# Patient Record
Sex: Female | Born: 1960 | Race: Black or African American | Hispanic: No | Marital: Married | Smoking: Never smoker
Health system: Southern US, Community
[De-identification: ages and names within clinical notes are randomized; demographics above are authoritative.]

## PROBLEM LIST (undated history)

## (undated) DIAGNOSIS — E7800 Pure hypercholesterolemia, unspecified: Secondary | ICD-10-CM

## (undated) HISTORY — DX: Pure hypercholesterolemia, unspecified: E78.00

## (undated) HISTORY — PX: MASTECTOMY: SHX3

---

## 1999-10-08 DIAGNOSIS — C50919 Malignant neoplasm of unspecified site of unspecified female breast: Secondary | ICD-10-CM

## 1999-10-08 HISTORY — PX: MASTECTOMY: SHX3

## 1999-10-08 HISTORY — DX: Malignant neoplasm of unspecified site of unspecified female breast: C50.919

## 2007-11-13 ENCOUNTER — Encounter: Admission: RE | Admit: 2007-11-13 | Discharge: 2007-11-13 | Payer: Self-pay | Admitting: *Deleted

## 2011-07-29 DIAGNOSIS — C50919 Malignant neoplasm of unspecified site of unspecified female breast: Secondary | ICD-10-CM | POA: Insufficient documentation

## 2011-12-19 ENCOUNTER — Encounter: Payer: Self-pay | Admitting: *Deleted

## 2011-12-19 ENCOUNTER — Emergency Department
Admission: EM | Admit: 2011-12-19 | Discharge: 2011-12-19 | Disposition: A | Discharge status: Home Health | Payer: BC Managed Care – PPO | Source: Home / Self Care

## 2011-12-19 DIAGNOSIS — K219 Gastro-esophageal reflux disease without esophagitis: Secondary | ICD-10-CM

## 2011-12-19 MED ORDER — PROMETHAZINE HCL 12.5 MG PO TABS: 12.5000 mg | ORAL_TABLET | Freq: Four times a day (QID) | ORAL | Status: AC | PRN

## 2011-12-19 MED ORDER — GI COCKTAIL ~~LOC~~
30.0000 mL | Freq: Once | ORAL | Status: AC
  Administered 2011-12-19: 30 mL via ORAL

## 2011-12-19 MED ORDER — RANITIDINE HCL 150 MG PO TABS: 150.0000 mg | ORAL_TABLET | Freq: Two times a day (BID) | ORAL | Status: AC

## 2011-12-19 NOTE — ED Notes (Signed)
Patient c/o persistent heart burn x 4 days that is progressively worsening. Yesterday she developed vomiting with diarrhea. The vomitng has stopped but diarrhea continues today. She has used Mylanta.

## 2011-12-19 NOTE — ED Provider Notes (Signed)
History     CSN: [id number]  Arrival date & time 12/19/11  0943   None     Chief Complaint  Patient presents with  . Heartburn  . Diarrhea    (Consider location/radiation/quality/duration/timing/severity/associated sxs/prior treatment) HPI Patient complains of persistent heartburn for the last 4 days that is progressively getting worse. She has had some nausea, vomiting and diarrhea recently and has had some sick contacts. She has used Mylanta which has helped a little bit. She states that she had a [employer] hamburger yesterday which made the symptoms worse. No chest pain, shortness of breath, diaphoresis. She states the discomfort is mostly in the upper middle chest area and into her throat and gets worse when she lays down at night. No fever or chills.  History reviewed. No pertinent past medical history.  Past Surgical History  Procedure Date  . Mastectomy     right    Family History  Problem Relation Age of Onset  . Heart failure Father   . Cancer Sister     uterine    History  Substance Use Topics  . Smoking status: Never Smoker   . Smokeless tobacco: Not on file  . Alcohol Use: No    OB History    Grav Para Term Preterm Abortions TAB SAB Ect Mult Living                  Review of Systems  All other systems reviewed and are negative.    Allergies  Review of patient's allergies indicates no known allergies.  Home Medications   Current Outpatient Rx  Name Route Sig Dispense Refill  . PROMETHAZINE HCL 12.5 MG PO TABS Oral Take 1 tablet (12.5 mg total) by mouth every 6 (six) hours as needed for nausea. 24 tablet 0  . RANITIDINE HCL 150 MG PO TABS Oral Take 1 tablet (150 mg total) by mouth 2 (two) times daily. 30 tablet 0    BP 99/68  Pulse 86  Temp(Src) 98.6 F (37 C) (Oral)  Resp 12  Ht 5\' 8"  (1.727 m)  Wt 176 lb (79.833 kg)  BMI 26.76 kg/m2  SpO2 99%  Physical Exam  Nursing note and vitals reviewed. Constitutional: She is oriented to  person, place, and time. She appears well-developed and well-nourished.  HENT:  Head: Normocephalic and atraumatic.  Eyes: No scleral icterus.  Neck: Neck supple.  Cardiovascular: Normal rate, regular rhythm and normal heart sounds.   Pulmonary/Chest: Effort normal and breath sounds normal. No respiratory distress. She has no decreased breath sounds. She has no wheezes. She has no rhonchi.  Abdominal: Soft. Normal appearance. There is no tenderness. There is no rigidity, no rebound, no guarding, no tenderness at McBurney's point and negative Murphy's sign.  Neurological: She is alert and oriented to person, place, and time.  Skin: Skin is warm and dry.  Psychiatric: She has a normal mood and affect. Her speech is normal.    ED Course  Procedures (including critical care time)  Labs Reviewed - No data to display No results found.   1. GERD (gastroesophageal reflux disease)       MDM   In clinic we gave her a GI cocktail which improved her symptoms by about 50%. Because of this we're going to treat her with Phenergan and Zantac for the next week or 2 to control stomach acid. If her symptoms are continuing she will need to followup with her PCP or a gastroenterologist. I did not see any  evidence of a cardiac etiology. If nausea vomiting or she develops any hematemesis or dark stools, ER precautions are given to her.  Encourage bland diet (nothing spicy, greasy, fried, or acidic), hydration, and rest.  If worsening of pain, nausea, or other symptoms, advised to contact your PCP.        Marlaine Hind, MD 12/19/11 1040

## 2012-09-23 DIAGNOSIS — M25519 Pain in unspecified shoulder: Secondary | ICD-10-CM | POA: Insufficient documentation

## 2013-08-31 DIAGNOSIS — E559 Vitamin D deficiency, unspecified: Secondary | ICD-10-CM | POA: Insufficient documentation

## 2014-04-15 ENCOUNTER — Other Ambulatory Visit (HOSPITAL_COMMUNITY): Payer: Self-pay | Admitting: Respiratory Therapy

## 2014-04-15 DIAGNOSIS — R0602 Shortness of breath: Secondary | ICD-10-CM

## 2014-04-25 ENCOUNTER — Encounter (HOSPITAL_COMMUNITY): Payer: BC Managed Care – PPO

## 2014-05-10 ENCOUNTER — Encounter (HOSPITAL_COMMUNITY): Payer: BC Managed Care – PPO

## 2014-05-11 ENCOUNTER — Ambulatory Visit
Admission: RE | Admit: 2014-05-11 | Discharge: 2014-05-11 | Disposition: A | Discharge status: Home / Self Care | Payer: BC Managed Care – PPO | Source: Ambulatory Visit | Attending: Emergency Medicine | Admitting: Emergency Medicine

## 2014-05-11 ENCOUNTER — Other Ambulatory Visit: Payer: Self-pay | Admitting: Emergency Medicine

## 2014-05-11 DIAGNOSIS — R0602 Shortness of breath: Secondary | ICD-10-CM

## 2014-05-11 MED ORDER — IOHEXOL 350 MG/ML SOLN
125.0000 mL | Freq: Once | INTRAVENOUS | Status: AC | PRN
  Administered 2014-05-11: 125 mL via INTRAVENOUS

## 2014-05-19 ENCOUNTER — Ambulatory Visit (HOSPITAL_COMMUNITY)
Admission: RE | Admit: 2014-05-19 | Discharge: 2014-05-19 | Disposition: A | Discharge status: Home / Self Care | Payer: BC Managed Care – PPO | Source: Ambulatory Visit | Attending: Emergency Medicine | Admitting: Emergency Medicine

## 2014-05-19 DIAGNOSIS — R0602 Shortness of breath: Secondary | ICD-10-CM | POA: Insufficient documentation

## 2014-05-19 MED ORDER — ALBUTEROL SULFATE (2.5 MG/3ML) 0.083% IN NEBU
2.5000 mg | INHALATION_SOLUTION | Freq: Once | RESPIRATORY_TRACT | Status: AC
  Administered 2014-05-19: 2.5 mg via RESPIRATORY_TRACT

## 2014-05-20 LAB — PULMONARY FUNCTION TEST
DL/VA % pred: 131 %
DL/VA: 6.9 ml/min/mmHg/L
DLCO UNC % PRED: 100 %
DLCO UNC: 29.83 ml/min/mmHg
FEF 25-75 PRE: 2.52 L/s
FEF 25-75 Post: 3.25 L/sec
FEF2575-%Change-Post: 29 %
FEF2575-%PRED-POST: 121 %
FEF2575-%Pred-Pre: 94 %
FEV1-%Change-Post: 7 %
FEV1-%Pred-Post: 89 %
FEV1-%Pred-Pre: 83 %
FEV1-PRE: 2.22 L
FEV1-Post: 2.38 L
FEV1FVC-%Change-Post: 4 %
FEV1FVC-%PRED-PRE: 101 %
FEV6-%Change-Post: 3 %
FEV6-%PRED-POST: 85 %
FEV6-%Pred-Pre: 83 %
FEV6-POST: 2.78 L
FEV6-Pre: 2.69 L
FEV6FVC-%CHANGE-POST: 0 %
FEV6FVC-%PRED-POST: 103 %
FEV6FVC-%PRED-PRE: 102 %
FVC-%CHANGE-POST: 2 %
FVC-%PRED-PRE: 81 %
FVC-%Pred-Post: 83 %
FVC-POST: 2.78 L
FVC-Pre: 2.7 L
PRE FEV1/FVC RATIO: 82 %
Post FEV1/FVC ratio: 86 %
Post FEV6/FVC ratio: 100 %
Pre FEV6/FVC Ratio: 100 %
RV % pred: 82 %
RV: 1.67 L
TLC % pred: 80 %
TLC: 4.53 L

## 2015-05-30 IMAGING — CT CT ANGIO CHEST
2 of 6 series · 19 of 36 positions shown · IV contrast ([ID] [id number])
Comparison: Chest radiograph November 13, 2007

CLINICAL DATA: Difficulty breathing; history of breast carcinoma

EXAM:
CT ANGIOGRAPHY CHEST WITH CONTRAST
TECHNIQUE: Multidetector CT imaging of the chest was performed using the
standard protocol during bolus administration of intravenous
contrast. Multiplanar CT image reconstructions and MIPs were
obtained to evaluate the vascular anatomy.
CONTRAST:  125mL OMNIPAQUE IOHEXOL 350 MG/ML SOLN

[Series 5: pe thin 1.25 · axial · 0.70mm/px · z∈[-292,-3]mm · 18 of 323 slices shown]
[im 17/323  lung]
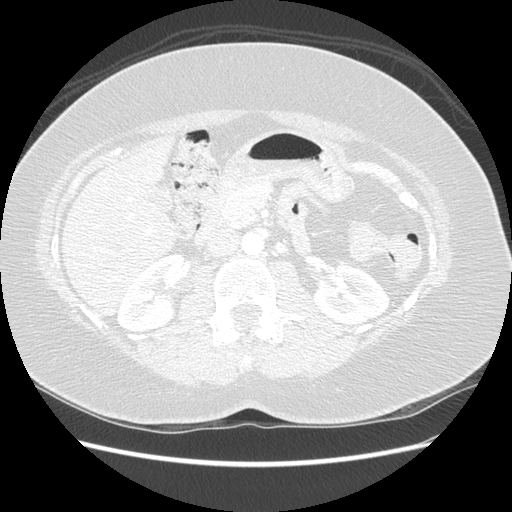
[im 34/323  mediastinal]
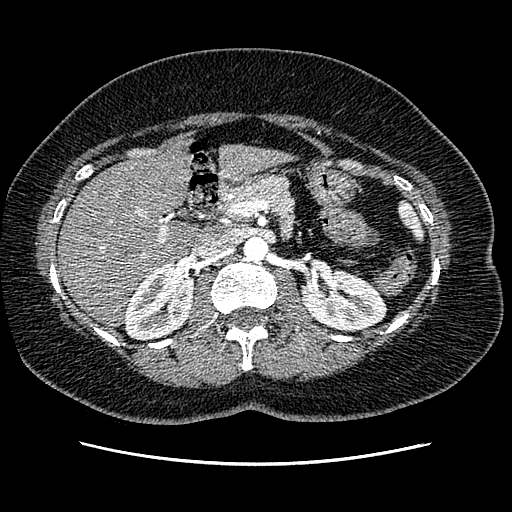
[im 51/323  lung]
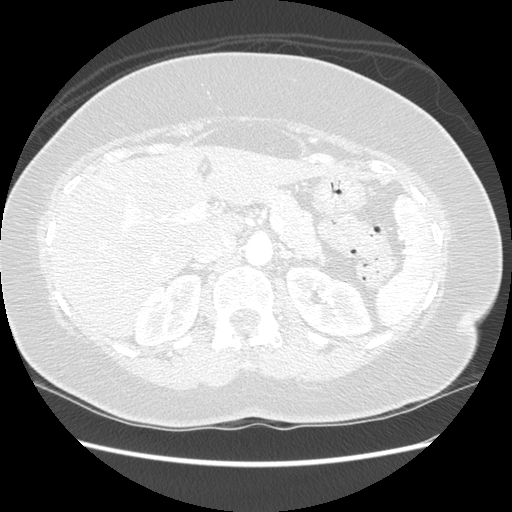
[im 68/323  mediastinal]
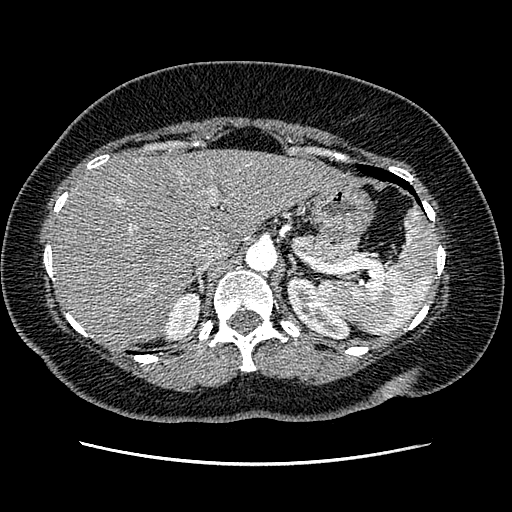
[im 85/323  lung]
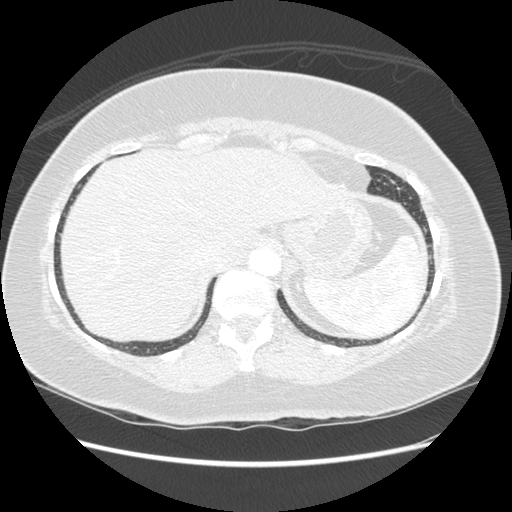
[im 102/323  mediastinal]
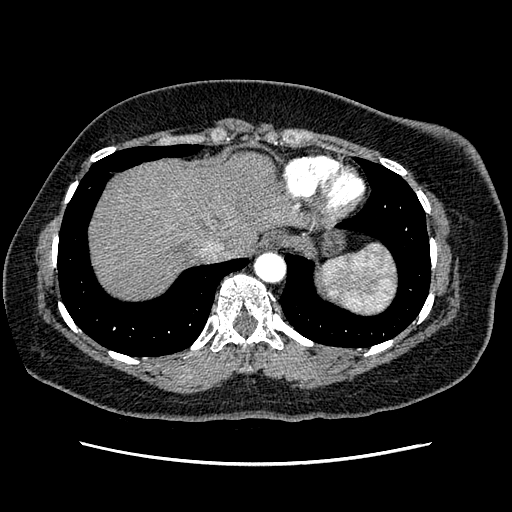
[im 119/323  lung]
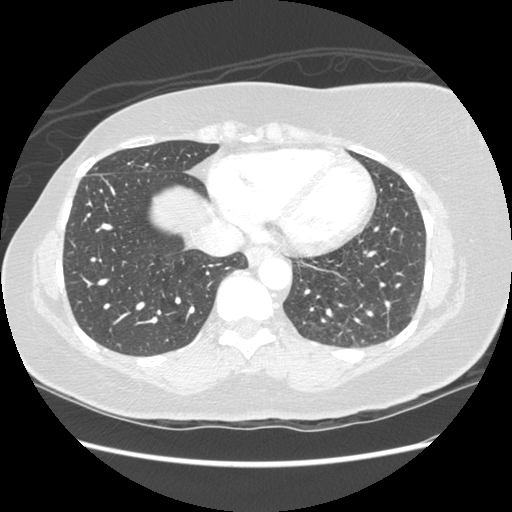
[im 136/323  mediastinal]
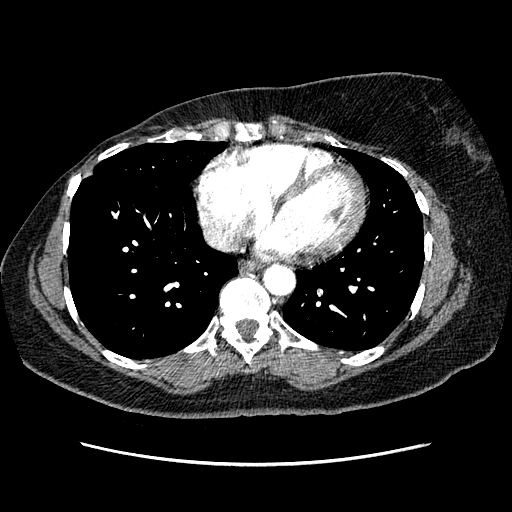
[im 153/323  lung]
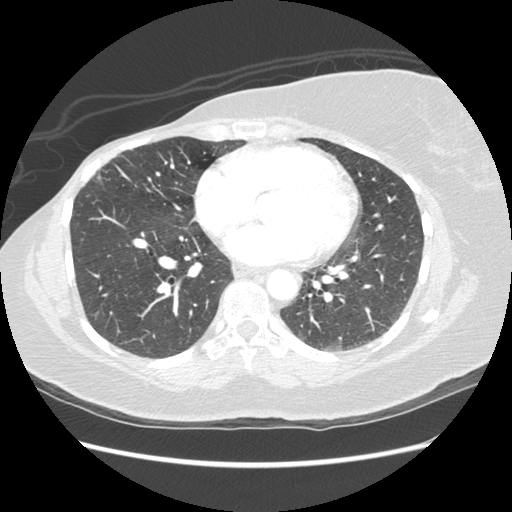
[im 170/323  mediastinal]
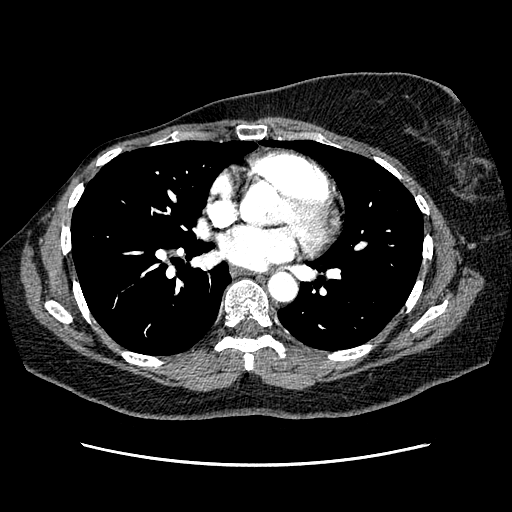
[im 187/323  lung]
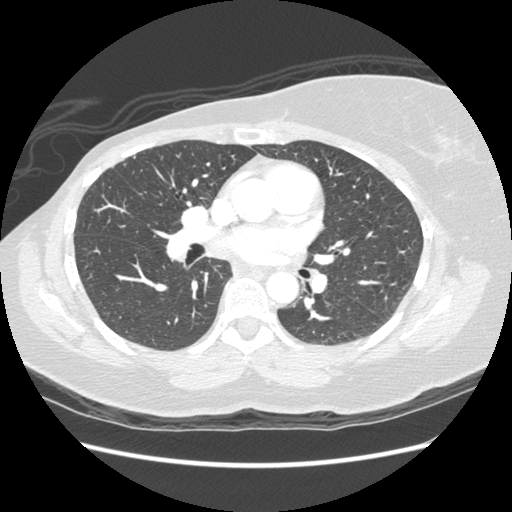
[im 204/323  mediastinal]
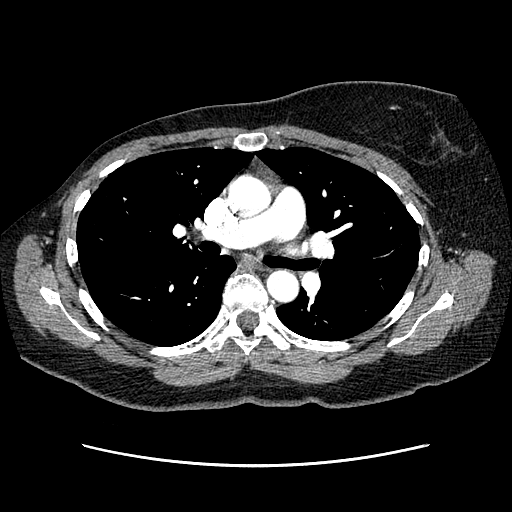
[im 221/323  lung]
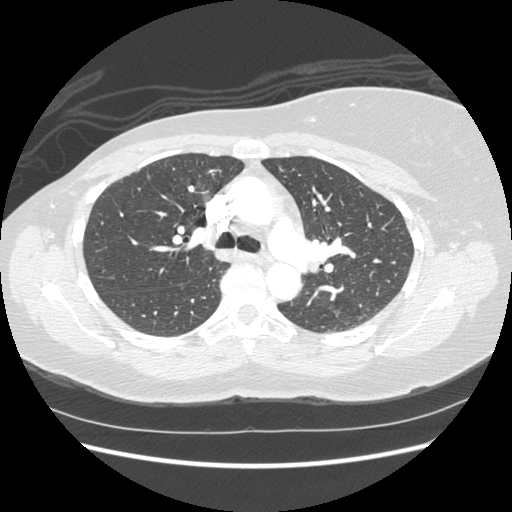
[im 238/323  mediastinal]
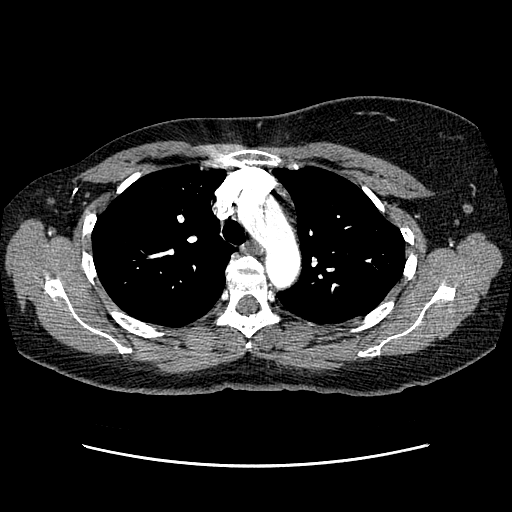
[im 255/323  lung]
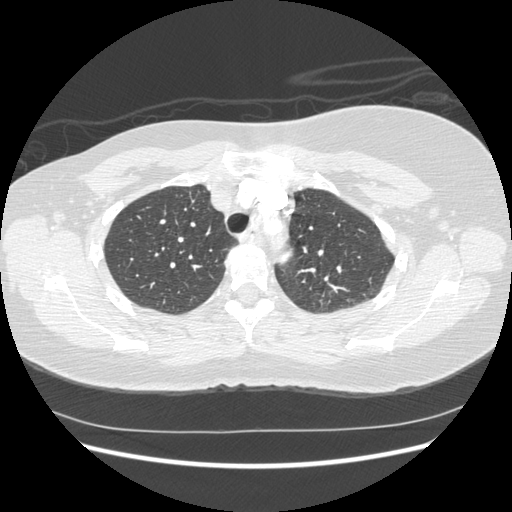
[im 272/323  mediastinal]
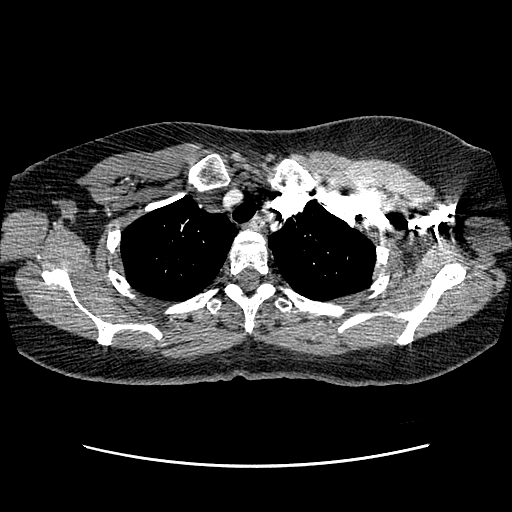
[im 289/323  lung]
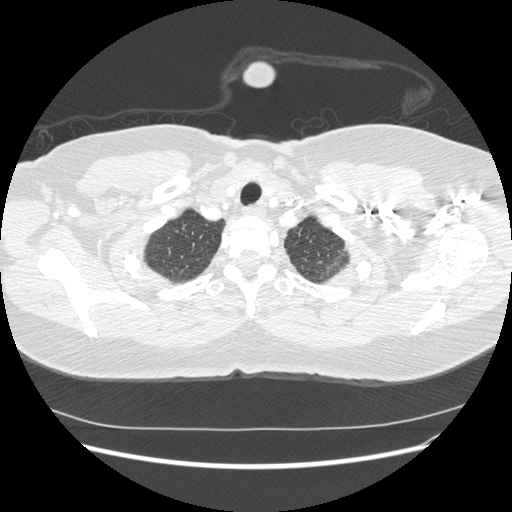
[im 306/323  mediastinal]
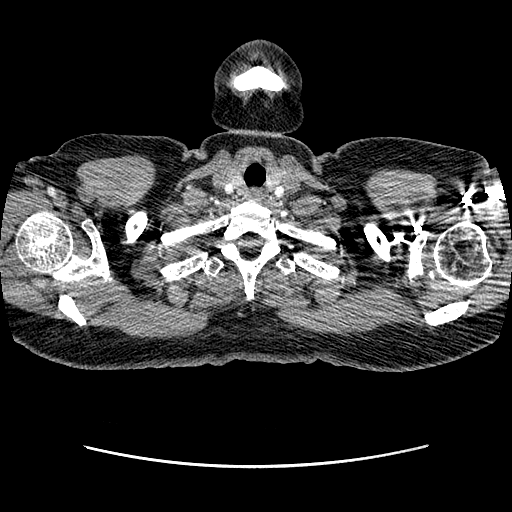

[Series 104: cor · coronal · 0.70mm/px · 1 of 134 slices shown]
[im 67/134  mediastinal]
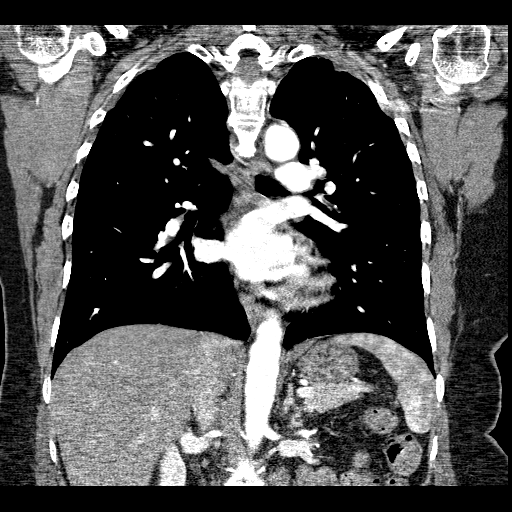

[19 of 36 positions shown; findings below may reference images not displayed]

FINDINGS: There is no demonstrable pulmonary embolus. There is no thoracic
aortic aneurysm or dissection. The patient is status post right
mastectomy with surgical clips in the right axilla.

There is mild underlying centrilobular emphysematous change. There
is patchy atelectatic change in the left lower lobe and in the
posterior segment of the left upper lobe. There is no frank edema or
consolidation.

There is no appreciable thoracic adenopathy. The pericardium is not
thickened. There is a rather minimal hiatal hernia. In the
visualized upper abdomen, there is fatty liver. Visualized upper
abdominal structures otherwise appear normal. There is degenerative
change in the thoracic spine. There are no blastic or lytic bone
lesions. Thyroid is unremarkable.

Review of the MIP images confirms the above findings.
IMPRESSION: No demonstrable pulmonary embolus. Status post right mastectomy.
Patchy atelectasis in the left lung. No edema or consolidation. No
mass or adenopathy appreciable. Fatty liver is noted.

## 2016-09-28 ENCOUNTER — Encounter: Payer: Self-pay | Admitting: Emergency Medicine

## 2016-09-28 ENCOUNTER — Emergency Department (INDEPENDENT_AMBULATORY_CARE_PROVIDER_SITE_OTHER)
Admission: EM | Admit: 2016-09-28 | Discharge: 2016-09-28 | Disposition: A | Discharge status: Home / Self Care | Payer: BLUE CROSS/BLUE SHIELD | Source: Home / Self Care | Attending: Family Medicine | Admitting: Family Medicine

## 2016-09-28 DIAGNOSIS — Z4889 Encounter for other specified surgical aftercare: Secondary | ICD-10-CM

## 2016-09-28 MED ORDER — MUPIROCIN 2 % EX OINT: 1.0000 "application " | TOPICAL_OINTMENT | Freq: Three times a day (TID) | CUTANEOUS | 0 refills | Status: DC

## 2016-09-28 NOTE — ED Triage Notes (Signed)
Pt had 2 cysts removed from vagina about 10 days ago by her GYN.  Pt feels that the area is opening back up and would like to be checked.  Currently on antbs.

## 2016-09-28 NOTE — ED Provider Notes (Signed)
[facility]    CSN: [id number] Arrival date & time: 09/28/16  1439     History   Chief Complaint Chief Complaint  Patient presents with  . Wound Check    HPI Anne Watkins is a 55 y.o. female.   Patient reports that she had two cysts removed from her left labia by her GYN ten days ago.  There has been no increase in pain or drainage, but she is concerned that the wound may be opening.  She is presently taking doxycycline.  No fevers, chills, and sweats.   The history is provided by the patient.    History reviewed. No pertinent past medical history.  There are no active problems to display for this patient.   Past Surgical History:  Procedure Laterality Date  . MASTECTOMY     right    OB History    No data available       Home Medications    Prior to Admission medications   Medication Sig Start Date End Date Taking? Authorizing Provider  doxycycline (VIBRAMYCIN) 100 MG capsule Take 100 mg by mouth 2 (two) times daily.   Yes Historical Provider, MD  mupirocin ointment (BACTROBAN) 2 % Apply 1 application topically 3 (three) times daily. 09/28/16   Kandra Nicolas, MD    Family History Family History  Problem Relation Age of Onset  . Heart failure Father   . Cancer Sister     uterine    Social History Social History  Substance Use Topics  . Smoking status: Never Smoker  . Smokeless tobacco: Never Used  . Alcohol use No     Allergies   Patient has no known allergies.   Review of Systems Review of Systems  Constitutional: Negative for activity change, chills, diaphoresis, fatigue and fever.  HENT: Negative.   Eyes: Negative.   Respiratory: Negative.   Cardiovascular: Negative.   Gastrointestinal: Negative.   Genitourinary: Positive for genital sores. Negative for difficulty urinating, dysuria, hematuria, pelvic pain, urgency, vaginal bleeding, vaginal discharge and vaginal pain.  Musculoskeletal: Negative.   Skin:  Negative.   Neurological: Negative for headaches.     Physical Exam Triage Vital Signs ED Triage Vitals  Enc Vitals Group     BP 09/28/16 1643 115/81     Pulse Rate 09/28/16 1643 73     Resp --      Temp 09/28/16 1643 97.6 F (36.4 C)     Temp Source 09/28/16 1643 Oral     SpO2 09/28/16 1643 100 %     Weight 09/28/16 1643 199 lb 8 oz (90.5 kg)     Height 09/28/16 1643 5\' 7"  (1.702 m)     Head Circumference --      Peak Flow --      Pain Score 09/28/16 1645 0     Pain Loc --      Pain Edu? --      Excl. in [place]? --    No data found.   Updated Vital Signs BP 115/81 (BP Location: Left Arm)   Pulse 73   Temp 97.6 F (36.4 C) (Oral)   Ht 5\' 7"  (1.702 m)   Wt 199 lb 8 oz (90.5 kg)   SpO2 100%   BMI 31.25 kg/m   Visual Acuity Right Eye Distance:   Left Eye Distance:   Bilateral Distance:    Right Eye Near:   Left Eye Near:    Bilateral Near:     Physical Exam  Constitutional: She appears well-developed and well-nourished. No distress.  HENT:  Head: Normocephalic.  Eyes: Pupils are equal, round, and reactive to light.  Neck: Neck supple.  Cardiovascular: Normal rate.   Pulmonary/Chest: Effort normal.  Abdominal: There is no tenderness.  Genitourinary:     Genitourinary Comments: Healing I & D wound left labia as noted on diagram. ; no evidence dehiscence.  Inferior aspect has an area of superficial excoriation without tenderness to palpation.  Lymphadenopathy:    She has no cervical adenopathy.  Neurological: She is alert.  Skin: Skin is warm and dry.  Nursing note and vitals reviewed.    UC Treatments / Results  Labs (all labs ordered are listed, but only abnormal results are displayed) Labs Reviewed - No data to display  EKG  EKG Interpretation None       Radiology No results found.  Procedures Procedures (including critical care time)  Medications Ordered in UC Medications - No data to display   Initial Impression / Assessment and  Plan / UC Course  I have reviewed the triage vital signs and the nursing notes.  Pertinent labs & imaging results that were available during my care of the patient were reviewed by me and considered in my medical decision making (see chart for details).  Clinical Course   Reassurance; surgical wound appears to be healing well Begin Bactrban ointment TID. Continue doxycycline as prescribed. Followup with gyn if not improved one week.     Final Clinical Impressions(s) / UC Diagnoses   Final diagnoses:  Encounter for post surgical wound check    New Prescriptions New Prescriptions   MUPIROCIN OINTMENT (BACTROBAN) 2 %    Apply 1 application topically 3 (three) times daily.     Kandra Nicolas, MD 10/14/16 1435

## 2016-09-28 NOTE — Discharge Instructions (Signed)
Continue doxycycline as prescribed

## 2020-05-19 LAB — HM MAMMOGRAPHY

## 2021-06-01 ENCOUNTER — Other Ambulatory Visit: Payer: Self-pay | Admitting: Family Medicine

## 2021-06-01 DIAGNOSIS — N644 Mastodynia: Secondary | ICD-10-CM

## 2021-06-06 ENCOUNTER — Other Ambulatory Visit: Payer: Self-pay | Admitting: Family Medicine

## 2021-06-06 DIAGNOSIS — N644 Mastodynia: Secondary | ICD-10-CM

## 2021-06-15 LAB — HM MAMMOGRAPHY

## 2022-05-21 ENCOUNTER — Other Ambulatory Visit (HOSPITAL_COMMUNITY)
Admission: RE | Admit: 2022-05-21 | Discharge: 2022-05-21 | Disposition: A | Discharge status: Home / Self Care | Payer: 59 | Source: Ambulatory Visit | Attending: Nurse Practitioner | Admitting: Nurse Practitioner

## 2022-05-21 ENCOUNTER — Other Ambulatory Visit: Payer: Self-pay | Admitting: Nurse Practitioner

## 2022-05-21 DIAGNOSIS — Z124 Encounter for screening for malignant neoplasm of cervix: Secondary | ICD-10-CM | POA: Insufficient documentation

## 2022-05-22 ENCOUNTER — Other Ambulatory Visit: Payer: Self-pay | Admitting: Nurse Practitioner

## 2022-05-22 DIAGNOSIS — Z780 Asymptomatic menopausal state: Secondary | ICD-10-CM

## 2022-05-22 LAB — CYTOLOGY - PAP
Comment: NEGATIVE
Diagnosis: NEGATIVE
High risk HPV: NEGATIVE

## 2022-05-29 ENCOUNTER — Other Ambulatory Visit: Payer: BLUE CROSS/BLUE SHIELD

## 2022-08-05 LAB — HM MAMMOGRAPHY

## 2022-10-05 ENCOUNTER — Ambulatory Visit
Admission: EM | Admit: 2022-10-05 | Discharge: 2022-10-05 | Disposition: A | Discharge status: Home / Self Care | Payer: 59 | Attending: Family Medicine | Admitting: Family Medicine

## 2022-10-05 ENCOUNTER — Other Ambulatory Visit: Payer: Self-pay

## 2022-10-05 DIAGNOSIS — J0100 Acute maxillary sinusitis, unspecified: Secondary | ICD-10-CM | POA: Diagnosis not present

## 2022-10-05 DIAGNOSIS — R059 Cough, unspecified: Secondary | ICD-10-CM | POA: Diagnosis not present

## 2022-10-05 DIAGNOSIS — J309 Allergic rhinitis, unspecified: Secondary | ICD-10-CM | POA: Diagnosis not present

## 2022-10-05 LAB — POC INFLUENZA A AND B ANTIGEN (URGENT CARE ONLY)
Influenza A Ag: NEGATIVE
Influenza B Ag: NEGATIVE

## 2022-10-05 LAB — POC SARS CORONAVIRUS 2 AG -  ED: SARS Coronavirus 2 Ag: NEGATIVE

## 2022-10-05 MED ORDER — PREDNISONE 20 MG PO TABS: ORAL_TABLET | ORAL | 0 refills | Status: DC

## 2022-10-05 MED ORDER — DOXYCYCLINE HYCLATE 100 MG PO CAPS: 100.0000 mg | ORAL_CAPSULE | Freq: Two times a day (BID) | ORAL | 0 refills | Status: AC

## 2022-10-05 MED ORDER — BENZONATATE 200 MG PO CAPS: 200.0000 mg | ORAL_CAPSULE | Freq: Three times a day (TID) | ORAL | 0 refills | Status: AC | PRN

## 2022-10-05 MED ORDER — FEXOFENADINE HCL 180 MG PO TABS: 180.0000 mg | ORAL_TABLET | Freq: Every day | ORAL | 0 refills | Status: DC

## 2022-10-05 NOTE — ED Triage Notes (Signed)
Pt c/o cough, sore throat and runny nose since Tuesday. Taking nyquil prn.

## 2022-10-05 NOTE — ED Provider Notes (Signed)
[facility]    CSN: [id number] Arrival date & time: 10/05/22  0954      History   Chief Complaint Chief Complaint  Patient presents with   Cough   Sore Throat   Nasal Congestion    HPI Anne Watkins is a 61 y.o. female.   HPI 61 year old female presents with cough, sore throat and runny nose since Tuesday.  History reviewed. No pertinent past medical history.  There are no problems to display for this patient.   Past Surgical History:  Procedure Laterality Date   MASTECTOMY     right    OB History   No obstetric history on file.      Home Medications    Prior to Admission medications   Medication Sig Start Date End Date Taking? Authorizing Provider  benzonatate (TESSALON) 200 MG capsule Take 1 capsule (200 mg total) by mouth 3 (three) times daily as needed for up to 7 days. 10/05/22 10/12/22 Yes Eliezer Lofts, FNP  benzonatate (TESSALON) 200 MG capsule Take 1 capsule (200 mg total) by mouth 3 (three) times daily as needed for up to 7 days. 10/05/22 10/12/22 Yes Eliezer Lofts, FNP  doxycycline (VIBRAMYCIN) 100 MG capsule Take 1 capsule (100 mg total) by mouth 2 (two) times daily for 7 days. 10/05/22 10/12/22 Yes Eliezer Lofts, FNP  doxycycline (VIBRAMYCIN) 100 MG capsule Take 1 capsule (100 mg total) by mouth 2 (two) times daily for 7 days. 10/05/22 10/12/22 Yes Eliezer Lofts, FNP  fexofenadine [facility] ALLERGY) 180 MG tablet Take 1 tablet (180 mg total) by mouth daily for 15 days. 10/05/22 10/20/22 Yes Eliezer Lofts, FNP  fexofenadine [facility] ALLERGY) 180 MG tablet Take 1 tablet (180 mg total) by mouth daily for 15 days. 10/05/22 10/20/22 Yes Eliezer Lofts, FNP  predniSONE (DELTASONE) 20 MG tablet Take 3 tabs PO daily x 5 days. 10/05/22  Yes Eliezer Lofts, FNP  predniSONE (DELTASONE) 20 MG tablet Take 3 tabs PO daily x 5 days. 10/05/22  Yes Eliezer Lofts, FNP  mupirocin ointment (BACTROBAN) 2 % Apply 1 application topically 3 (three) times daily.  09/28/16   Kandra Nicolas, MD    Family History Family History  Problem Relation Age of Onset   Heart failure Father    Cancer Sister        uterine    Social History Social History   Tobacco Use   Smoking status: Never   Smokeless tobacco: Never  Substance Use Topics   Alcohol use: No   Drug use: No     Allergies   Patient has no known allergies.   Review of Systems Review of Systems  HENT:  Positive for congestion and sore throat.   Respiratory:  Positive for cough.   All other systems reviewed and are negative.    Physical Exam Triage Vital Signs ED Triage Vitals  Enc Vitals Group     BP 10/05/22 1041 124/87     Pulse Rate 10/05/22 1041 (!) 132     Resp 10/05/22 1041 17     Temp 10/05/22 1041 100.1 F (37.8 C)     Temp Source 10/05/22 1041 Oral     SpO2 10/05/22 1041 97 %     Weight --      Height --      Head Circumference --      Peak Flow --      Pain Score 10/05/22 1042 0     Pain Loc --  Pain Edu? --      Excl. in [place]? --    No data found.  Updated Vital Signs BP 124/87 (BP Location: Left Arm)   Pulse (!) 132   Temp 100.1 F (37.8 C) (Oral)   Resp 17   SpO2 97%   Visual Acuity Right Eye Distance:   Left Eye Distance:   Bilateral Distance:    Right Eye Near:   Left Eye Near:    Bilateral Near:     Physical Exam Vitals and nursing note reviewed.  Constitutional:      Appearance: Normal appearance. She is ill-appearing.  HENT:     Head: Normocephalic and atraumatic.     Right Ear: Tympanic membrane and external ear normal.     Left Ear: Tympanic membrane and external ear normal.     Ears:     Comments: Significant eustachian tube dysfunction noted bilaterally    Nose:     Comments: Turbinates are erythematous/edematous    Mouth/Throat:     Mouth: Mucous membranes are moist.     Pharynx: Oropharynx is clear.     Comments: Moderate amount of clear drainage of posterior oropharynx noted Eyes:     Extraocular Movements:  Extraocular movements intact.     Conjunctiva/sclera: Conjunctivae normal.     Pupils: Pupils are equal, round, and reactive to light.  Cardiovascular:     Rate and Rhythm: Normal rate and regular rhythm.     Pulses: Normal pulses.     Heart sounds: Normal heart sounds. No murmur heard. Pulmonary:     Effort: Pulmonary effort is normal.     Breath sounds: Normal breath sounds. No wheezing, rhonchi or rales.     Comments: Infrequent nonproductive cough noted Musculoskeletal:        General: Normal range of motion.     Cervical back: Normal range of motion and neck supple.  Skin:    General: Skin is warm and dry.  Neurological:     General: No focal deficit present.     Mental Status: She is alert and oriented to person, place, and time. Mental status is at baseline.      UC Treatments / Results  Labs (all labs ordered are listed, but only abnormal results are displayed) Labs Reviewed  POC SARS CORONAVIRUS 2 AG -  ED  POC INFLUENZA A AND B ANTIGEN (URGENT CARE ONLY)    EKG   Radiology No results found.  Procedures Procedures (including critical care time)  Medications Ordered in UC Medications - No data to display  Initial Impression / Assessment and Plan / UC Course  I have reviewed the triage vital signs and the nursing notes.  Pertinent labs & imaging results that were available during my care of the patient were reviewed by me and considered in my medical decision making (see chart for details).     MDM:1.  Acute maxillary sinusitis, recurrence not specified-Rx'd doxycycline; 2.  Cough-Rx'd prednisone, Tessalon; 3.  Allergic rhinitis-Rx'd Allegra. Advised patient to take medications as directed with food to completion.  Ice patient to take prednisone and Allegra with first dose of doxycycline for the next 5 of 7 days.  May use Allegra as needed afterwards for concurrent postnasal drainage/drip.  Advised patient may take Tessalon Per[place] daily or as needed for cough.   Encourage patient increase daily water intake to 64 ounces per day while taking these medications.  Advised if symptoms worsen and/or unresolved please follow-up with PCP or here for  further evaluation.  Discharged home, hemodynamically stable. Final Clinical Impressions(s) / UC Diagnoses   Final diagnoses:  Cough, unspecified type  Acute maxillary sinusitis, recurrence not specified  Allergic rhinitis, unspecified seasonality, unspecified trigger     Discharge Instructions      Advised patient to take medications as directed with food to completion.  Ice patient to take prednisone and Allegra with first dose of doxycycline for the next 5 of 7 days.  May use Allegra as needed afterwards for concurrent postnasal drainage/drip.  Advised patient may take Tessalon [place] daily or as needed for cough.  Encourage patient increase daily water intake to 64 ounces per day while taking these medications.  Advised if symptoms worsen and/or unresolved please follow-up with PCP or here for further evaluation.     ED Prescriptions     Medication Sig Dispense Auth. Provider   doxycycline (VIBRAMYCIN) 100 MG capsule Take 1 capsule (100 mg total) by mouth 2 (two) times daily for 7 days. 14 capsule Eliezer Lofts, FNP   fexofenadine [facility] ALLERGY) 180 MG tablet Take 1 tablet (180 mg total) by mouth daily for 15 days. 15 tablet Eliezer Lofts, FNP   predniSONE (DELTASONE) 20 MG tablet Take 3 tabs PO daily x 5 days. 15 tablet Eliezer Lofts, FNP   benzonatate (TESSALON) 200 MG capsule Take 1 capsule (200 mg total) by mouth 3 (three) times daily as needed for up to 7 days. 40 capsule Eliezer Lofts, FNP   benzonatate (TESSALON) 200 MG capsule Take 1 capsule (200 mg total) by mouth 3 (three) times daily as needed for up to 7 days. 40 capsule Eliezer Lofts, FNP   fexofenadine [facility] ALLERGY) 180 MG tablet Take 1 tablet (180 mg total) by mouth daily for 15 days. 15 tablet Eliezer Lofts, FNP   doxycycline  (VIBRAMYCIN) 100 MG capsule Take 1 capsule (100 mg total) by mouth 2 (two) times daily for 7 days. 14 capsule Eliezer Lofts, FNP   predniSONE (DELTASONE) 20 MG tablet Take 3 tabs PO daily x 5 days. 15 tablet Eliezer Lofts, FNP      PDMP not reviewed this encounter.   Eliezer Lofts, [place] 10/05/22 1145

## 2022-10-05 NOTE — Discharge Instructions (Addendum)
Advised patient to take medications as directed with food to completion.  Ice patient to take prednisone and Allegra with first dose of doxycycline for the next 5 of 7 days.  May use Allegra as needed afterwards for concurrent postnasal drainage/drip.  Advised patient may take Tessalon [place] daily or as needed for cough.  Encourage patient increase daily water intake to 64 ounces per day while taking these medications.  Advised if symptoms worsen and/or unresolved please follow-up with PCP or here for further evaluation.

## 2022-10-11 ENCOUNTER — Ambulatory Visit
Admission: EM | Admit: 2022-10-11 | Discharge: 2022-10-11 | Disposition: A | Discharge status: Home / Self Care | Payer: 59 | Attending: Family Medicine | Admitting: Family Medicine

## 2022-10-11 DIAGNOSIS — H6993 Unspecified Eustachian tube disorder, bilateral: Secondary | ICD-10-CM | POA: Diagnosis not present

## 2022-10-11 DIAGNOSIS — S76012A Strain of muscle, fascia and tendon of left hip, initial encounter: Secondary | ICD-10-CM | POA: Diagnosis not present

## 2022-10-11 DIAGNOSIS — R0981 Nasal congestion: Secondary | ICD-10-CM | POA: Diagnosis not present

## 2022-10-11 NOTE — ED Triage Notes (Signed)
Pt c/o groin pain x 2 days. Also c/o RT ear pain for 2 days. Some wheezing at night. Was seen last weekend for cold sxs. Tx with abx and prednisone as well as cough meds.

## 2022-10-11 NOTE — ED Provider Notes (Signed)
[name]    CSN: [id number] Arrival date & time: 10/11/22  1110      History   Chief Complaint Chief Complaint  Patient presents with   Groin Pain    LT   Otalgia    RT    HPI Ellarie Picking is a 62 y.o. female.   Patient complains of right ear pain for two days, a persistent mild cough with occasional wheezing at night, and intermittent pain in her left groin for two days. Review of records reveals that she developed URI symptoms about 10-11 days ago, and was treated 6 days ago for URI, acute maxillary sinusitis, and allergic rhinitis with doxycycline, prednisone, Allegra, and Tessalon perles.  She admits that she has generally improved except for the persistent mild cough and wheezing.  She denies pleuritic pain, shortness of breath, and fever, and feels generally well. She denies injury or changes in activities that could cause groin pain.  She denies swelling in her groin area and rash.  She notes that she normally walks regularly, but has been inactive for about 10 days and has just resumed walking.  The history is provided by the patient.  Groin Pain This is a new problem. The current episode started 2 days ago. The problem occurs constantly. The problem has not changed since onset.Pertinent negatives include no shortness of breath. The symptoms are aggravated by walking. Nothing relieves the symptoms. She has tried nothing for the symptoms.    History reviewed. No pertinent past medical history.  There are no problems to display for this patient.   Past Surgical History:  Procedure Laterality Date   MASTECTOMY     right    OB History   No obstetric history on file.      Home Medications    Prior to Admission medications   Medication Sig Start Date End Date Taking? Authorizing Provider  benzonatate (TESSALON) 200 MG capsule Take 1 capsule (200 mg total) by mouth 3 (three) times daily as needed for up to 7 days. 10/05/22 10/12/22  Eliezer Lofts, FNP  benzonatate (TESSALON) 200 MG capsule Take 1 capsule (200 mg total) by mouth 3 (three) times daily as needed for up to 7 days. 10/05/22 10/12/22  Eliezer Lofts, FNP  doxycycline (VIBRAMYCIN) 100 MG capsule Take 1 capsule (100 mg total) by mouth 2 (two) times daily for 7 days. 10/05/22 10/12/22  Eliezer Lofts, FNP  doxycycline (VIBRAMYCIN) 100 MG capsule Take 1 capsule (100 mg total) by mouth 2 (two) times daily for 7 days. 10/05/22 10/12/22  Eliezer Lofts, FNP  fexofenadine (ALLEGRA ALLERGY) 180 MG tablet Take 1 tablet (180 mg total) by mouth daily for 15 days. 10/05/22 10/20/22  Eliezer Lofts, FNP  fexofenadine (ALLEGRA ALLERGY) 180 MG tablet Take 1 tablet (180 mg total) by mouth daily for 15 days. 10/05/22 10/20/22  Eliezer Lofts, FNP  mupirocin ointment (BACTROBAN) 2 % Apply 1 application topically 3 (three) times daily. 09/28/16   Kandra Nicolas, MD  predniSONE (DELTASONE) 20 MG tablet Take 3 tabs PO daily x 5 days. 10/05/22   Eliezer Lofts, FNP  predniSONE (DELTASONE) 20 MG tablet Take 3 tabs PO daily x 5 days. 10/05/22   Eliezer Lofts, FNP    Family History Family History  Problem Relation Age of Onset   Heart failure Father    Cancer Sister        uterine    Social History Social History   Tobacco Use   Smoking status: Never  Smokeless tobacco: Never  Substance Use Topics   Alcohol use: No   Drug use: No     Allergies   Patient has no known allergies.   Review of Systems Review of Systems  Constitutional: Negative.   HENT:  Positive for congestion and ear pain. Negative for facial swelling, sinus pain and sore throat.   Eyes: Negative.   Respiratory:  Positive for cough and wheezing. Negative for chest tightness and shortness of breath.   Cardiovascular: Negative.   Gastrointestinal: Negative.   Genitourinary: Negative.   Musculoskeletal: Negative.   Neurological: Negative.   Hematological:  Negative for adenopathy.     Physical Exam Triage  Vital Signs ED Triage Vitals  Enc Vitals Group     BP 10/11/22 1201 110/70     Pulse Rate 10/11/22 1201 91     Resp 10/11/22 1201 17     Temp 10/11/22 1201 98.4 F (36.9 C)     Temp Source 10/11/22 1201 Oral     SpO2 10/11/22 1201 97 %     Weight --      Height --      Head Circumference --      Peak Flow --      Pain Score 10/11/22 1202 0     Pain Loc --      Pain Edu? --      Excl. in [place]? --    No data found.  Updated Vital Signs BP 110/70 (BP Location: Left Arm)   Pulse 91   Temp 98.4 F (36.9 C) (Oral)   Resp 17   SpO2 97%   Visual Acuity Right Eye Distance:   Left Eye Distance:   Bilateral Distance:    Right Eye Near:   Left Eye Near:    Bilateral Near:     Physical Exam Vitals and nursing note reviewed.  Constitutional:      General: She is not in acute distress. HENT:     Head: Normocephalic.     Right Ear: Tympanic membrane and ear canal normal.     Left Ear: Tympanic membrane and ear canal normal.     Nose: Nose normal. No congestion.     Mouth/Throat:     Mouth: Mucous membranes are moist.     Pharynx: Oropharynx is clear.  Eyes:     Extraocular Movements: Extraocular movements intact.     Conjunctiva/sclera: Conjunctivae normal.     Pupils: Pupils are equal, round, and reactive to light.  Cardiovascular:     Rate and Rhythm: Normal rate and regular rhythm.     Heart sounds: Normal heart sounds.  Pulmonary:     Breath sounds: Normal breath sounds.  Abdominal:     Palpations: Abdomen is soft.     Tenderness: There is no abdominal tenderness.  Musculoskeletal:     Cervical back: Neck supple.     Right lower leg: No edema.     Left lower leg: No edema.       Legs:     Comments: Left hip has normal passive range of motion. There is distinct tenderness to palpation over the left hip flexors during resisted hip flexion.  Lymphadenopathy:     Cervical: No cervical adenopathy.  Skin:    General: Skin is warm and dry.     Findings: No rash.   Neurological:     Mental Status: She is alert and oriented to person, place, and time.      UC Treatments / Results  Labs (all labs ordered are listed, but only abnormal results are displayed) Labs Reviewed - No data to display  EKG   Radiology No results found.  Procedures Procedures (including critical care time)  Medications Ordered in UC Medications - No data to display  Initial Impression / Assessment and Plan / UC Course  I have reviewed the triage vital signs and the nursing notes.  Pertinent labs & imaging results that were available during my care of the patient were reviewed by me and considered in my medical decision making (see chart for details).    Patient appears to be recovering from recent URI without present evidence of infection, but has persistent mild sinus congestion, eustachian tube dysfunction, and post-infectious cough..  Discussed methods to reduce her sinus and ear congestion. Given treatment instructions with range of motion and stretching exercises for her left hip flexor strain. Followup with Dr. Aundria Mems ([facility]) if not improving about two weeks.   Final Clinical Impressions(s) / UC Diagnoses   Final diagnoses:  Strain of flexor muscle of left hip, initial encounter  Sinus congestion  Eustachian tube dysfunction, bilateral     Discharge Instructions      Take plain guaifenesin ('1200mg'$  extended release tabs such as Mucinex) twice daily, with plenty of water, for cough and congestion.  Take Pseudoephedrine ('30mg'$ , one or two every 4 to 6 hours) for sinus and ear congestion.  Get adequate rest.   May use Afrin nasal spray (or generic oxymetazoline) each morning for about 5 days and then discontinue.  Also recommend using saline nasal spray several times daily and saline nasal irrigation (AYR is a common brand).  Use Flonase nasal spray each morning after using Afrin nasal spray and saline nasal irrigation.  Apply  ice pack to left groin for 20 to 30 minutes, 3 to 4 times daily  Continue until pain and swelling decrease.  May take Ibuprofen '200mg'$ , 4 tabs every 8 hours with food.  Begin range of motion and stretching exercises as tolerated.     ED Prescriptions   None       Kandra Nicolas, MD 10/12/22 [phone]

## 2022-10-11 NOTE — Discharge Instructions (Signed)
Take plain guaifenesin ('1200mg'$  extended release tabs such as Mucinex) twice daily, with plenty of water, for cough and congestion.  Take Pseudoephedrine ('30mg'$ , one or two every 4 to 6 hours) for sinus and ear congestion.  Get adequate rest.   May use Afrin nasal spray (or generic oxymetazoline) each morning for about 5 days and then discontinue.  Also recommend using saline nasal spray several times daily and saline nasal irrigation (AYR is a common brand).  Use Flonase nasal spray each morning after using Afrin nasal spray and saline nasal irrigation.  Apply ice pack to left groin for 20 to 30 minutes, 3 to 4 times daily  Continue until pain and swelling decrease.  May take Ibuprofen '200mg'$ , 4 tabs every 8 hours with food.  Begin range of motion and stretching exercises as tolerated.

## 2022-10-21 ENCOUNTER — Encounter: Payer: Self-pay | Admitting: Family Medicine

## 2022-10-21 ENCOUNTER — Ambulatory Visit (INDEPENDENT_AMBULATORY_CARE_PROVIDER_SITE_OTHER): Payer: 59 | Admitting: Family Medicine

## 2022-10-21 VITALS — BP 99/65 | HR 69 | Temp 98.1°F | Ht 67.0 in | Wt 205.4 lb

## 2022-10-21 DIAGNOSIS — R682 Dry mouth, unspecified: Secondary | ICD-10-CM | POA: Diagnosis not present

## 2022-10-21 DIAGNOSIS — Z23 Encounter for immunization: Secondary | ICD-10-CM

## 2022-10-21 DIAGNOSIS — Z7689 Persons encountering health services in other specified circumstances: Secondary | ICD-10-CM

## 2022-10-21 DIAGNOSIS — S161XXA Strain of muscle, fascia and tendon at neck level, initial encounter: Secondary | ICD-10-CM | POA: Diagnosis not present

## 2022-10-21 MED ORDER — BACLOFEN 5 MG PO TABS: 5.0000 mg | ORAL_TABLET | Freq: Three times a day (TID) | ORAL | 0 refills | Status: DC | PRN

## 2022-10-21 NOTE — Progress Notes (Signed)
New Patient Office Visit  Subjective    Patient ID: Anne Watkins, female    DOB: August 27, 1961  Age: 62 y.o. MRN: [id number]  CC:  Chief Complaint  Patient presents with   New Patient (Initial Visit)    Patient in office to est PCP -also c/o pain Right side neck and Right shoulder- ibuprofen does help pain but pain keeps coming back - she is concerned this is pain related to gall bladder problems. - excessive dry mouth x  on and off 2 years causes difficulty sleeping occasionally .    HPI Anne Watkins presents to establish care.   Not taking any medicines currently. Pt had dx of right breast cancer in 2001. She has had mastectomy that same year. She completed radiation for a few weeks.  She just had pap smear in Aug 2023. She had colonoscopy in 2014 in [place], [place].  Pt complains of right sided neck pain on and off. The pain is intermittently and not daily. She has seen health services where she works and used heating pads that helped. Pain is from her posterior neck and radiates down to right shoulder. She used Ibuprofen for this pain and it helped. This pain has been present for the last 3-4 years.  Pains is worse when moving her neck.  Pt reports she thought it was her gallbladder. She says as soon as she eats, she has diarrhea. This has been present for the last 6 months. No belching. Some acid reflux symptoms intermittently  Pt also reports dry mouth for the last several years. Not everyday. She reports drinking water but some days may not be enough. Some days 4-5 bottles a day.  Pt would like first dose of shingles vaccine Outpatient Encounter Medications as of 10/21/2022  Medication Sig   Baclofen 5 MG TABS Take 5 mg by mouth 3 (three) times daily as needed.   ibuprofen (ADVIL) 600 MG tablet Take 600 mg by mouth every 6 (six) hours as needed.   [DISCONTINUED] fexofenadine (ALLEGRA ALLERGY) 180 MG tablet Take 1 tablet (180 mg total) by mouth daily for 15 days.    [DISCONTINUED] fexofenadine (ALLEGRA ALLERGY) 180 MG tablet Take 1 tablet (180 mg total) by mouth daily for 15 days.   [DISCONTINUED] mupirocin ointment (BACTROBAN) 2 % Apply 1 application topically 3 (three) times daily.   [DISCONTINUED] predniSONE (DELTASONE) 20 MG tablet Take 3 tabs PO daily x 5 days.   [DISCONTINUED] predniSONE (DELTASONE) 20 MG tablet Take 3 tabs PO daily x 5 days.   No facility-administered encounter medications on file as of 10/21/2022.    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    Past Surgical History:  Procedure Laterality Date   MASTECTOMY Right 2001    Family History  Problem Relation Age of Onset   Cancer Mother        uterine   Heart failure Father    Dementia Sister    Healthy Sister    Diabetes Brother    Hypertension Brother    Glaucoma Brother    Glaucoma Brother    Blindness Brother    Scoliosis Daughter    Scoliosis Son    Stroke Maternal Uncle    Pneumonia Maternal Grandmother    Hypertension Maternal Grandfather     Social History   Socioeconomic History   Marital status: Married    Spouse name: errol   Number of children: 2  Years of education: Not on file   Highest education level: Not on file  Occupational History   Occupation: English as a second language teacher  Tobacco Use   Smoking status: Never   Smokeless tobacco: Never  Vaping Use   Vaping Use: Never used  Substance and Sexual Activity   Alcohol use: No   Drug use: No   Sexual activity: Yes    Birth control/protection: None  Other Topics Concern   Not on file  Social History Narrative   Not on file   Social Determinants of Health   Financial Resource Strain: Not on file  Food Insecurity: Not on file  Transportation Needs: Not on file  Physical Activity: Not on file  Stress: Not on file  Social Connections: Not on file  Intimate Partner Violence: Not on file    Review of Systems  Constitutional:  Negative  for malaise/fatigue.  HENT:         Dry mouth  Respiratory:  Negative for cough and sputum production.   Cardiovascular:  Negative for chest pain and claudication.  Gastrointestinal:  Negative for abdominal pain, nausea and vomiting.  Genitourinary:  Negative for dysuria and frequency.  Musculoskeletal:  Positive for joint pain and neck pain.  Neurological:  Negative for dizziness and headaches.  All other systems reviewed and are negative.       Objective    BP 99/65   Pulse 69   Temp 98.1 F (36.7 C)   Ht '5\' 7"'$  (1.702 m)   Wt 205 lb 6 oz (93.2 kg)   SpO2 100%   BMI 32.17 kg/m   Physical Exam Vitals and nursing note reviewed.  Constitutional:      Appearance: Normal appearance. She is normal weight.  HENT:     Head: Normocephalic and atraumatic.     Right Ear: Tympanic membrane normal.     Left Ear: Tympanic membrane normal.     Nose: Nose normal.     Mouth/Throat:     Mouth: Mucous membranes are moist.     Pharynx: Oropharynx is clear.  Eyes:     Conjunctiva/sclera: Conjunctivae normal.     Pupils: Pupils are equal, round, and reactive to light.  Neck:     Comments: Ttp to right trapezius with muscle spasm present. Full ROM of cervical spine with some pain produced with right rotation Cardiovascular:     Rate and Rhythm: Normal rate and regular rhythm.     Pulses: Normal pulses.     Heart sounds: Normal heart sounds.  Pulmonary:     Effort: Pulmonary effort is normal.     Breath sounds: Normal breath sounds.  Abdominal:     General: Abdomen is flat. Bowel sounds are normal. There is no distension.     Tenderness: There is no abdominal tenderness. There is no guarding.  Musculoskeletal:     Cervical back: Normal range of motion.  Skin:    General: Skin is warm.     Capillary Refill: Capillary refill takes less than 2 seconds.  Neurological:     General: No focal deficit present.     Mental Status: She is alert and oriented to person, place, and time.  Mental status is at baseline.  Psychiatric:        Mood and Affect: Mood normal.        Behavior: Behavior normal.        Thought Content: Thought content normal.        Judgment: Judgment normal.  Assessment & Plan:   Problem List Items Addressed This Visit   None Visit Diagnoses     Encounter to establish care with new doctor    -  Primary   Strain of cervical portion of right trapezius muscle       Relevant Medications   Baclofen 5 MG TABS   Dry mouth       Relevant Orders   Comprehensive metabolic panel   Hemoglobin A1c   Need for shingles vaccine       Relevant Orders   Varicella-zoster vaccine IM      Suspect right neck pain is from trapezius strain. To continue Ibuprofen and will add Baclofen to use prn. Also continue heating pads. If no better in 4-6 weeks, defer for imaging of cervical spine. Due to dry mouth, will screen metabolic labs with F7C and kidney function. Advised to drink more water as dry mouth is also an indication of dehydration. Shingles vaccine #1 today. Return in 2-6 months for 2nd dose.  No follow-ups on file.   Leeanne Rio, MD

## 2022-10-22 LAB — COMPREHENSIVE METABOLIC PANEL
ALT: 16 IU/L (ref 0–32)
AST: 18 IU/L (ref 0–40)
Albumin/Globulin Ratio: 1.5 (ref 1.2–2.2)
Albumin: 4.1 g/dL (ref 3.8–4.9)
Alkaline Phosphatase: 99 IU/L (ref 44–121)
BUN/Creatinine Ratio: 11 — ABNORMAL LOW (ref 12–28)
BUN: 9 mg/dL (ref 8–27)
Bilirubin Total: 0.5 mg/dL (ref 0.0–1.2)
CO2: 24 mmol/L (ref 20–29)
Calcium: 9.2 mg/dL (ref 8.7–10.3)
Chloride: 105 mmol/L (ref 96–106)
Creatinine, Ser: 0.84 mg/dL (ref 0.57–1.00)
Globulin, Total: 2.8 g/dL (ref 1.5–4.5)
Glucose: 96 mg/dL (ref 70–99)
Potassium: 4.1 mmol/L (ref 3.5–5.2)
Sodium: 141 mmol/L (ref 134–144)
Total Protein: 6.9 g/dL (ref 6.0–8.5)
eGFR: 80 mL/min/{1.73_m2} (ref >59)

## 2022-10-22 LAB — HEMOGLOBIN A1C
Est. average glucose Bld gHb Est-mCnc: 134 mg/dL
Hgb A1c MFr Bld: 6.3 % — ABNORMAL HIGH (ref 4.8–5.6)

## 2022-11-03 ENCOUNTER — Other Ambulatory Visit: Payer: Self-pay | Admitting: Family Medicine

## 2022-11-03 DIAGNOSIS — S161XXA Strain of muscle, fascia and tendon at neck level, initial encounter: Secondary | ICD-10-CM

## 2023-01-21 ENCOUNTER — Ambulatory Visit (INDEPENDENT_AMBULATORY_CARE_PROVIDER_SITE_OTHER): Payer: 59 | Admitting: Family Medicine

## 2023-01-21 VITALS — Temp 97.8°F

## 2023-01-21 DIAGNOSIS — N3942 Incontinence without sensory awareness: Secondary | ICD-10-CM | POA: Insufficient documentation

## 2023-01-21 DIAGNOSIS — Z23 Encounter for immunization: Secondary | ICD-10-CM | POA: Diagnosis not present

## 2023-01-21 NOTE — Progress Notes (Signed)
   Subjective:    Patient ID: Anne Watkins, female    DOB: Dec 11, 1960, 62 y.o.   MRN: [id number]  HPI Patient was her for second Shingrix vaccination, Review of Systems     Objective:   Physical Exam        Assessment & Plan:   Injection was given in Left Deltoid, without any problems or concerns

## 2023-03-05 ENCOUNTER — Encounter: Payer: Self-pay | Admitting: Family Medicine

## 2023-07-14 ENCOUNTER — Other Ambulatory Visit: Payer: Self-pay | Admitting: Family Medicine

## 2023-07-14 DIAGNOSIS — I2510 Atherosclerotic heart disease of native coronary artery without angina pectoris: Secondary | ICD-10-CM

## 2023-07-14 NOTE — Progress Notes (Signed)
H/o HLD and fmhx of CAD/MI in father who died at age 61.

## 2024-07-09 LAB — HEPATIC FUNCTION PANEL
ALT: 12 U/L (ref 7–35)
AST: 17 (ref 13–35)
Alkaline Phosphatase: 111 (ref 25–125)
Bilirubin, Total: 0.4

## 2024-07-09 LAB — BASIC METABOLIC PANEL WITH GFR
BUN: 11 (ref 4–21)
Chloride: 103 (ref 99–108)
Creatinine: 0.8 (ref 0.5–1.1)
Glucose: 88
Potassium: 4.4 meq/L (ref 3.5–5.1)
Sodium: 139 (ref 137–147)

## 2024-07-09 LAB — LIPID PANEL
Cholesterol: 226 — AB (ref 0–200)
HDL: 66 (ref 35–70)
LDL Cholesterol: 148
LDl/HDL Ratio: 3.4
Triglycerides: 71 (ref 40–160)

## 2024-07-09 LAB — CBC AND DIFFERENTIAL
HCT: 39 (ref 36–46)
Hemoglobin: 12 (ref 12.0–16.0)
Platelets: 261 K/uL (ref 150–400)
WBC: 4.2

## 2024-07-09 LAB — CBC: RBC: 4.35 (ref 3.87–5.11)

## 2024-07-09 LAB — IRON,TIBC AND FERRITIN PANEL: Iron: 65

## 2024-07-10 LAB — COMPREHENSIVE METABOLIC PANEL WITH GFR
Albumin: 4.3 (ref 3.5–5.0)
Calcium: 9.2 (ref 8.7–10.7)
Free T4: 1.6 ng/dL
Globulin: 2.7
TSH: 2.57 (ref 0.41–5.90)
eGFR: 81

## 2024-07-12 ENCOUNTER — Encounter: Payer: Self-pay | Admitting: Family Medicine

## 2024-07-13 ENCOUNTER — Other Ambulatory Visit: Payer: Self-pay | Admitting: Family Medicine

## 2024-07-13 DIAGNOSIS — I2510 Atherosclerotic heart disease of native coronary artery without angina pectoris: Secondary | ICD-10-CM

## 2024-07-13 NOTE — Progress Notes (Signed)
 Moderate ASCVD risk given FMHx and HLD.  CAC ordered.

## 2024-07-14 ENCOUNTER — Ambulatory Visit: Admitting: Bariatrics

## 2024-07-19 ENCOUNTER — Other Ambulatory Visit: Payer: Self-pay | Admitting: Family Medicine

## 2024-07-19 ENCOUNTER — Ambulatory Visit: Admitting: Bariatrics

## 2024-07-19 ENCOUNTER — Encounter: Payer: Self-pay | Admitting: Bariatrics

## 2024-07-19 VITALS — BP 115/70 | HR 81 | Temp 97.5°F | Ht 67.0 in | Wt 199.0 lb

## 2024-07-19 DIAGNOSIS — Z6831 Body mass index (BMI) 31.0-31.9, adult: Secondary | ICD-10-CM | POA: Diagnosis not present

## 2024-07-19 DIAGNOSIS — R7303 Prediabetes: Secondary | ICD-10-CM | POA: Diagnosis not present

## 2024-07-19 DIAGNOSIS — E669 Obesity, unspecified: Secondary | ICD-10-CM | POA: Diagnosis not present

## 2024-07-19 DIAGNOSIS — E559 Vitamin D deficiency, unspecified: Secondary | ICD-10-CM

## 2024-07-19 DIAGNOSIS — E6609 Other obesity due to excess calories: Secondary | ICD-10-CM

## 2024-07-19 NOTE — Progress Notes (Signed)
 Office: [phone]  /  Fax: [phone]   Initial Visit  Anne Watkins was seen in clinic today to evaluate for obesity. She is interested in losing weight to improve overall health and reduce the risk of weight related complications. She presents today to review program treatment options, initial physical assessment, and evaluation.     She was referred by: Friend or Family  When asked what else they would like to accomplish? She states: Adopt a healthier eating pattern and lifestyle, Improve energy levels and physical activity, Improve existing medical conditions, and Improve quality of life  When asked how has your weight affected you? She states: Problems with eating patterns (snacking at night)  Some associated conditions: Hyperlipidemia and Prediabetes  Contributing factors: moderate to high levels of stress, reduced physical activity, menopause, sedentary job, and need for convenient foods  Weight promoting medications identified: None  Current nutrition plan: Portion control / smart choices and Other: decreased her sugar intake.   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  Peak Weight:199 lbs , Body Fat%:40%, Visceral Fat Rating:11, Weight trend over the last 12 months: Unchanged  General:  Alert, oriented and cooperative. Patient is in no acute distress.  Respiratory: Normal respiratory effort, no problems with respiration noted  Extremities: Normal range of motion.     Mental Status: Normal mood and affect. Normal behavior. Normal judgment and thought content.   DIAGNOSTIC DATA REVIEWED:  BMET    Component Value Date/Time   NA 141 10/21/2022 0951   K 4.1 10/21/2022 0951   CL 105 10/21/2022 0951   CO2 24 10/21/2022 0951   GLUCOSE 96 10/21/2022 0951   BUN 9 10/21/2022 0951   CREATININE 0.84 10/21/2022 0951   CALCIUM 9.2 10/21/2022 0951   Lab Results  Component Value Date   HGBA1C 6.3 (H) 10/21/2022   No results found for: INSULIN CBC No results found for: WBC, RBC, HGB, HCT, PLT, MCV, MCH, MCHC, RDW Iron/TIBC/Ferritin/ %Sat No results found for: IRON, TIBC, FERRITIN, IRONPCTSAT Lipid Panel  No results found for: CHOL, TRIG, HDL, CHOLHDL, VLDL, LDLCALC, LDLDIRECT Hepatic Function Panel     Component Value Date/Time   PROT 6.9 10/21/2022 0951   ALBUMIN 4.1 10/21/2022 0951   AST 18 10/21/2022 0951   ALT 16 10/21/2022 0951   ALKPHOS 99 10/21/2022 0951   BILITOT 0.5 10/21/2022 0951   No results found for: TSH   Assessment and Plan:   Prediabetes Last A1c was 6.3  Medication(s): none Lab Results  Component Value Date   HGBA1C 6.3 (H) 10/21/2022   No results found for: INSULIN  Plan: Will minimize all refined carbohydrates both sweets and starches.  Will begin the program and start the plan and exercise.  Will check a Hemoglobin A1c and insulin at her first visit.     Vitamin D Deficiency Vitamin D is not at goal of 50 per patient .  She is not on vitamin D  No results found for: VD25OH  Plan: Will check vitamin D at her next visit.     Generalized Obesity: Current BMI 31.17    Obesity Treatment / Action Plan:  Patient will work on garnering support from family and friends to begin weight loss journey. Will work on eliminating or reducing the presence of highly palatable, calorie dense foods in the home. Will complete provided nutritional and psychosocial assessment  questionnaire before the next appointment. Will be scheduled for indirect calorimetry to determine resting energy expenditure in a fasting state.  This will allow us  to create a reduced calorie, high-protein meal plan to promote loss of fat mass while preserving muscle mass. Counseled on the health benefits of losing 5%-15% of total body weight. Was counseled on nutritional approaches to weight loss and benefits of reducing processed foods and consuming plant-based foods and high quality protein as part of nutritional weight management. Was counseled on pharmacotherapy and role as an adjunct in weight management.   Obesity Education Performed Today:  She was weighed on the bioimpedance scale and results were discussed and documented in the synopsis.  We discussed obesity as a disease and the importance of a more detailed evaluation of all the factors contributing to the disease.  We discussed the importance of long term lifestyle changes which include nutrition, exercise and behavioral modifications as well as the importance of customizing this to her specific health and social needs.  We discussed the benefits of reaching a healthier weight to alleviate the symptoms of existing conditions and reduce the risks of the biomechanical, metabolic and psychological effects of obesity.  Discussed New Patient/Late Arrival, and Cancellation Policies. Patient voiced understanding and allowed to ask questions.   Anne Watkins appears to be in the action stage of change and states they are ready to start intensive lifestyle modifications and behavioral modifications.  30 minutes was spent today on this visit including the above counseling, pre-visit chart review, and post-visit documentation.  Reviewed by clinician on day of visit: allergies, medications, problem list, medical history, surgical history, family history, social history, and previous encounter notes.    Rory Xiang A. Delores CORDOBAO.

## 2024-07-20 ENCOUNTER — Ambulatory Visit (INDEPENDENT_AMBULATORY_CARE_PROVIDER_SITE_OTHER): Payer: Self-pay

## 2024-07-20 DIAGNOSIS — I2510 Atherosclerotic heart disease of native coronary artery without angina pectoris: Secondary | ICD-10-CM

## 2024-08-02 ENCOUNTER — Ambulatory Visit: Admitting: Bariatrics

## 2024-08-02 ENCOUNTER — Encounter: Payer: Self-pay | Admitting: Bariatrics

## 2024-08-02 VITALS — BP 106/74 | HR 75 | Ht 67.0 in | Wt 199.0 lb

## 2024-08-02 DIAGNOSIS — R7303 Prediabetes: Secondary | ICD-10-CM

## 2024-08-02 DIAGNOSIS — R5383 Other fatigue: Secondary | ICD-10-CM

## 2024-08-02 DIAGNOSIS — Z0000 Encounter for general adult medical examination without abnormal findings: Secondary | ICD-10-CM

## 2024-08-02 DIAGNOSIS — E669 Obesity, unspecified: Secondary | ICD-10-CM

## 2024-08-02 DIAGNOSIS — Z1331 Encounter for screening for depression: Secondary | ICD-10-CM

## 2024-08-02 DIAGNOSIS — E559 Vitamin D deficiency, unspecified: Secondary | ICD-10-CM | POA: Diagnosis not present

## 2024-08-02 DIAGNOSIS — R0602 Shortness of breath: Secondary | ICD-10-CM | POA: Diagnosis not present

## 2024-08-02 DIAGNOSIS — Z6831 Body mass index (BMI) 31.0-31.9, adult: Secondary | ICD-10-CM | POA: Diagnosis not present

## 2024-08-02 DIAGNOSIS — E66811 Obesity, class 1: Secondary | ICD-10-CM

## 2024-08-02 NOTE — Progress Notes (Signed)
 At a Glance:  Vitals Temp: -- (could not obtain due to drinking water) BP: 106/74 Pulse Rate: 75 SpO2: 99 %   Anthropometric Measurements Height: 5' 7 (1.702 m) Weight: 199 lb (90.3 kg) BMI (Calculated): 31.16 Starting Weight: 199lb Peak Weight: 205lb   Body Composition  Body Fat %: 39.9 % Fat Mass (lbs): 79.4 lbs Muscle Mass (lbs): 113.8 lbs Total Body Water (lbs): 74.2 lbs Visceral Fat Rating : 11   Other Clinical Data RMR: 1584 Fasting: yes Labs: yes Today's Visit #: 1 Starting Date: 08/02/24    EKG: Normal sinus rhythm, rate 76.  Indirect Calorimeter:   Resting Metabolic Rate ( RMR):  RMR (actual): 1584 kcal RMR (calculated): 1638 kcal The calculated basal metabolic rate is 8361 kcal thus her basal metabolic rate is worse than expected.  Plan:   Indirect calorimeter completed, interpreted and reviewed with patient today and allowed to ask questions.  Discussed the implications for the chosen plan and exercise based on the RMR reading.  Will consider repeating the RMR in the future based on weight loss.    Chief Complaint:  Obesity   Subjective:  Anne Watkins (MR# [id number]) is a 63 y.o. female who presents for evaluation and treatment of obesity and related comorbidities.   Anne Watkins is currently in the action stage of change and ready to dedicate time achieving and maintaining a healthier weight. Anne Watkins is interested in becoming our patient and working on intensive lifestyle modifications including (but not limited to) diet and exercise for weight loss.  Anne Watkins has been struggling with her weight. She has been unsuccessful in either losing weight, maintaining weight loss, or reaching her healthy weight goal.  Anne Watkins habits were reviewed today and are as follows: she thinks her family will eat healthier with her, she started gaining weight at age 79, she is a picky eater and doesn't like to eat healthier foods, she frequently makes poor food  choices, and she struggles with emotional eating.   Current or previous pharmacotherapy: None  Response to medication: Never tried medications  Other Fatigue Anne Watkins denies daytime somnolence and denies waking up still tired. Patient has a history of symptoms of daytime fatigue. Anne Watkins generally gets 6 or 7 hours of sleep per night, and states that she has generally restful sleep. Snoring is present. Apneic episodes are present. Epworth Sleepiness Score is 7.   Shortness of Breath Anne Watkins notes increasing shortness of breath with exercising and seems to be worsening over time with weight gain. She notes getting out of breath sooner with activity than she used to. This has not gotten worse recently. Anne Watkins denies shortness of breath at rest or orthopnea.  Depression Screen Anne Watkins Food and Mood (modified PHQ-9) score was 4. <5 no depression     08/02/2024    7:22 AM  Depression screen PHQ 2/9  Decreased Interest 0  Down, Depressed, Hopeless 0  PHQ - 2 Score 0  Altered sleeping 1  Tired, decreased energy 0  Change in appetite 0  Feeling bad or failure about yourself  0  Trouble concentrating 0  Moving slowly or fidgety/restless 0  Suicidal thoughts 0  PHQ-9 Score 1  Difficult doing work/chores Not difficult at all     Assessment and Plan:   Other Fatigue Anne Watkins does feel that her weight is causing her energy to be lower than it should be. Fatigue may be related to obesity, depression or many other causes. Labs will be ordered, and in the meanwhile, Anne Watkins will  focus on self care including making healthy food choices, increasing physical activity and focusing on stress reduction.  Shortness of Breath Anne Watkins does not feel that she gets out of breath more easily that she used to when she exercises. Anne Watkins shortness of breath appears to be obesity related and exercise induced. She has agreed to work on weight loss and gradually increase exercise to treat her exercise induced shortness of breath.  Will continue to monitor closely.  Health Maintenance:   Obesity   Plan: Will do EKG, indirect calorimetry, and labs.     Vitamin D Deficiency She is at risk for vitamin D deficiency due to obesity.  She is on not on any vitamin D. She is not taking any supplements.  No results found for: VD25OH  Plan: Will check for vitamin D deficiency.  If deficient , will consider a vitamin D supplement.  Will get a minimal amount of sun exposure.   Prediabetes Last A1c was 6.3  Medication(s): none Lab Results  Component Value Date   HGBA1C 6.3 (H) 10/21/2022   No results found for: INSULIN  Plan: Will minimize all refined carbohydrates both sweets and starches.  Will work on the plan and exercise.  Consider both aerobic and resistance training. Will come up with an exercise plan in the upcoming weeks.  Will keep protein, water, and fiber intake high.  Will check a Hemoglobin A1c and fasting insulin. Will do more meal planning.  Will cook more at home.  Previous labs reviewed today. Date: 07/09/2024 CMP, Lipids, Thyroid Panel, and CBC  Labs done today CMP, Lipids, Insulin, HgbA1c, Vit D, Thyroid Panel, and CBC   Generalized Obesity: BMI (Calculated): 31.16   [name] is currently in the action stage of change and her goal is to begin weight loss efforts. I recommend Anne Watkins begin the structured treatment plan as follows:  She has agreed to Category 2 Plan  Exercise goals: All adults should avoid inactivity. Some activity is better than none, and adults who participate in any amount of physical activity, gain some health benefits.  Behavioral modification strategies:increasing lean protein intake, decreasing simple carbohydrates, increasing vegetables, increase H2O intake, no skipping meals, meal planning and cooking strategies, keeping healthy foods in the home, avoiding temptations, and planning for success  She was informed of the importance of frequent follow-up visits to  maximize her success with intensive lifestyle modifications for her multiple health conditions. She was informed we would discuss her lab results at her next visit unless there is a critical issue that needs to be addressed sooner. Anne Watkins agreed to keep her next visit at the agreed upon time to discuss these results.  Objective:  General: Cooperative, alert, well developed, in no acute distress. HEENT: Conjunctivae and lids unremarkable. Cardiovascular: Regular rhythm.  Lungs: Normal work of breathing. Neurologic: No focal deficits.   Lab Results  Component Value Date   CREATININE 0.8 07/09/2024   BUN 11 07/09/2024   NA 139 07/09/2024   K 4.4 07/09/2024   CL 103 07/09/2024   CO2 24 10/21/2022   Lab Results  Component Value Date   ALT 12 07/09/2024   AST 17 07/09/2024   ALKPHOS 111 07/09/2024   BILITOT 0.5 10/21/2022   Lab Results  Component Value Date   HGBA1C 6.3 (H) 10/21/2022   No results found for: INSULIN No results found for: TSH Lab Results  Component Value Date   CHOL 226 (A) 07/09/2024   HDL 66 07/09/2024   LDLCALC 148 07/09/2024  TRIG 71 07/09/2024   Lab Results  Component Value Date   WBC 4.2 07/09/2024   HGB 12.0 07/09/2024   HCT 39 07/09/2024   PLT 261 07/09/2024   Lab Results  Component Value Date   IRON 65 07/09/2024    Attestation Statements:  Applicable history such as the following:  allergies, medications, problem list, medical history, surgical history, family history, social history, and previous encounter notes reviewed by clinician on day of visit:  Time spent on visit in care of the patient today including the items listed below was 40 minutes.    20 minutes were spent talking about the history, 20 minutes for face to face counseling implementing the plan, discussing the specifics of how to arrange meals, meal planning, water intake.   I spent face to face time discussing his/her plan, including breakfast, additional breakfast  options, lunch, and dinner options, grocery list, and snacks.  I reviewed her indirect calorimetry. I discussed the implications for the diet plan.    Discussed the bio-impedence test (fat %, muscle mass, and water weight) and allowed the patient to ask questions.   Discussed the following information sheets: Category 2, Grocery List, 100 Calorie Snacks, and 200 Calorie Snacks.   I reviewed the labs which were ordered from her visit on 07/09/24.   I additionally spent time documenting, reviewing, and checking the codes before submitting.   This may have been prepared with the assistance of Engineer, Civil (consulting).  Occasional wrong-word or sound-a-like substitutions may have occurred due to the inherent limitations of voice recognition software.    Clayborne Daring, DO

## 2024-08-03 ENCOUNTER — Encounter: Payer: Self-pay | Admitting: Bariatrics

## 2024-08-03 DIAGNOSIS — R7303 Prediabetes: Secondary | ICD-10-CM | POA: Insufficient documentation

## 2024-08-03 DIAGNOSIS — E7800 Pure hypercholesterolemia, unspecified: Secondary | ICD-10-CM | POA: Insufficient documentation

## 2024-08-03 LAB — LIPID PANEL WITH LDL/HDL RATIO
Cholesterol, Total: 208 mg/dL — ABNORMAL HIGH (ref 100–199)
HDL: 68 mg/dL (ref >39)
LDL Chol Calc (NIH): 127 mg/dL — ABNORMAL HIGH (ref 0–99)
LDL/HDL Ratio: 1.9 ratio (ref 0.0–3.2)
Triglycerides: 73 mg/dL (ref 0–149)
VLDL Cholesterol Cal: 13 mg/dL (ref 5–40)

## 2024-08-03 LAB — CBC WITH DIFFERENTIAL/PLATELET
Basophils Absolute: 0 x10E3/uL (ref 0.0–0.2)
Basos: 0 %
EOS (ABSOLUTE): 0.1 x10E3/uL (ref 0.0–0.4)
Eos: 2 %
Hematocrit: 39.3 % (ref 34.0–46.6)
Hemoglobin: 12.4 g/dL (ref 11.1–15.9)
Immature Grans (Abs): 0 x10E3/uL (ref 0.0–0.1)
Immature Granulocytes: 0 %
Lymphocytes Absolute: 1.6 x10E3/uL (ref 0.7–3.1)
Lymphs: 35 %
MCH: 28.4 pg (ref 26.6–33.0)
MCHC: 31.6 g/dL (ref 31.5–35.7)
MCV: 90 fL (ref 79–97)
Monocytes Absolute: 0.3 x10E3/uL (ref 0.1–0.9)
Monocytes: 6 %
Neutrophils Absolute: 2.6 x10E3/uL (ref 1.4–7.0)
Neutrophils: 57 %
Platelets: 252 x10E3/uL (ref 150–450)
RBC: 4.37 x10E6/uL (ref 3.77–5.28)
RDW: 12.9 % (ref 11.7–15.4)
WBC: 4.6 x10E3/uL (ref 3.4–10.8)

## 2024-08-03 LAB — COMPREHENSIVE METABOLIC PANEL WITH GFR
ALT: 10 IU/L (ref 0–32)
AST: 19 IU/L (ref 0–40)
Albumin: 4.4 g/dL (ref 3.9–4.9)
Alkaline Phosphatase: 108 IU/L (ref 49–135)
BUN/Creatinine Ratio: 17 (ref 12–28)
BUN: 13 mg/dL (ref 8–27)
Bilirubin Total: 0.5 mg/dL (ref 0.0–1.2)
CO2: 23 mmol/L (ref 20–29)
Calcium: 9.6 mg/dL (ref 8.7–10.3)
Chloride: 102 mmol/L (ref 96–106)
Creatinine, Ser: 0.77 mg/dL (ref 0.57–1.00)
Globulin, Total: 2.8 g/dL (ref 1.5–4.5)
Glucose: 97 mg/dL (ref 70–99)
Potassium: 4.4 mmol/L (ref 3.5–5.2)
Sodium: 139 mmol/L (ref 134–144)
Total Protein: 7.2 g/dL (ref 6.0–8.5)
eGFR: 87 mL/min/1.73 (ref >59)

## 2024-08-03 LAB — HEMOGLOBIN A1C
Est. average glucose Bld gHb Est-mCnc: 126 mg/dL
Hgb A1c MFr Bld: 6 % — ABNORMAL HIGH (ref 4.8–5.6)

## 2024-08-03 LAB — INSULIN, RANDOM: INSULIN: 10 u[IU]/mL (ref 2.6–24.9)

## 2024-08-03 LAB — TSH+T4F+T3FREE
Free T4: 1.11 ng/dL (ref 0.82–1.77)
T3, Free: 3.1 pg/mL (ref 2.0–4.4)
TSH: 2.64 u[IU]/mL (ref 0.450–4.500)

## 2024-08-03 LAB — VITAMIN D 25 HYDROXY (VIT D DEFICIENCY, FRACTURES): Vit D, 25-Hydroxy: 40.7 ng/mL (ref 30.0–100.0)

## 2024-08-17 ENCOUNTER — Ambulatory Visit: Admitting: Bariatrics
